# Patient Record
Sex: Male | Born: 1975 | Race: Black or African American | Hispanic: No | Marital: Single | Smoking: Current every day smoker
Health system: Southern US, Community
[De-identification: ages and names within clinical notes are randomized; demographics above are authoritative.]

---

## 1999-06-14 ENCOUNTER — Emergency Department (HOSPITAL_COMMUNITY): Admission: EM | Admit: 1999-06-14 | Discharge: 1999-06-14 | Payer: Self-pay | Admitting: Emergency Medicine

## 1999-06-14 ENCOUNTER — Encounter: Payer: Self-pay | Admitting: Emergency Medicine

## 2000-08-20 ENCOUNTER — Emergency Department (HOSPITAL_COMMUNITY): Admission: EM | Admit: 2000-08-20 | Discharge: 2000-08-20 | Payer: Self-pay | Admitting: Emergency Medicine

## 2003-10-10 ENCOUNTER — Emergency Department (HOSPITAL_COMMUNITY): Admission: EM | Admit: 2003-10-10 | Discharge: 2003-10-10 | Payer: Self-pay | Admitting: Family Medicine

## 2004-12-25 ENCOUNTER — Emergency Department (HOSPITAL_COMMUNITY): Admission: EM | Admit: 2004-12-25 | Discharge: 2004-12-25 | Payer: Self-pay | Admitting: Family Medicine

## 2006-01-09 ENCOUNTER — Emergency Department (HOSPITAL_COMMUNITY): Admission: EM | Admit: 2006-01-09 | Discharge: 2006-01-09 | Payer: Self-pay | Admitting: Family Medicine

## 2007-03-30 ENCOUNTER — Inpatient Hospital Stay (HOSPITAL_COMMUNITY): Admission: AC | Admit: 2007-03-30 | Discharge: 2007-04-14 | Payer: Self-pay
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
## 2011-02-20 LAB — CBC
HCT: 21.1 — ABNORMAL LOW
HCT: 21.3 — ABNORMAL LOW
HCT: 24.9 — ABNORMAL LOW
HCT: 25.2 — ABNORMAL LOW
HCT: 27 — ABNORMAL LOW
HCT: 27.9 — ABNORMAL LOW
HCT: 33.2 — ABNORMAL LOW
Hemoglobin: 10.3 — ABNORMAL LOW
Hemoglobin: 11 — ABNORMAL LOW
Hemoglobin: 11.2 — ABNORMAL LOW
Hemoglobin: 11.8 — ABNORMAL LOW
Hemoglobin: 7 — CL
Hemoglobin: 9.2 — ABNORMAL LOW
Hemoglobin: 9.4 — ABNORMAL LOW
Hemoglobin: 9.4 — ABNORMAL LOW
MCHC: 33.1
MCHC: 33.4
MCHC: 33.9
MCHC: 34
MCHC: 34.5
MCHC: 34.8
MCV: 76.6 — ABNORMAL LOW
MCV: 80.3
MCV: 81.4
MCV: 83.1
MCV: 83.9
MCV: 84.4
Platelets: 103 — ABNORMAL LOW
Platelets: 138 — ABNORMAL LOW
Platelets: 179
Platelets: 264
Platelets: 371
Platelets: 506 — ABNORMAL HIGH
Platelets: 60 — ABNORMAL LOW
Platelets: 75 — ABNORMAL LOW
Platelets: 85 — ABNORMAL LOW
RBC: 2.37 — ABNORMAL LOW
RBC: 2.82 — ABNORMAL LOW
RBC: 3.21 — ABNORMAL LOW
RBC: 3.29 — ABNORMAL LOW
RBC: 3.65 — ABNORMAL LOW
RBC: 4.07 — ABNORMAL LOW
RBC: 4.68
RDW: 16.9 — ABNORMAL HIGH
RDW: 17.4 — ABNORMAL HIGH
RDW: 17.8 — ABNORMAL HIGH
RDW: 17.9 — ABNORMAL HIGH
RDW: 18.1 — ABNORMAL HIGH
RDW: 18.2 — ABNORMAL HIGH
RDW: 18.3 — ABNORMAL HIGH
RDW: 19 — ABNORMAL HIGH
RDW: 19.2 — ABNORMAL HIGH
RDW: 19.4 — ABNORMAL HIGH
RDW: 19.7 — ABNORMAL HIGH
RDW: 19.7 — ABNORMAL HIGH
WBC: 10.2
WBC: 10.2
WBC: 10.8 — ABNORMAL HIGH
WBC: 11 — ABNORMAL HIGH
WBC: 12.1 — ABNORMAL HIGH
WBC: 14.8 — ABNORMAL HIGH
WBC: 19.1 — ABNORMAL HIGH

## 2011-02-20 LAB — PREPARE FRESH FROZEN PLASMA

## 2011-02-20 LAB — BASIC METABOLIC PANEL
BUN: 12
BUN: 15
BUN: 3 — ABNORMAL LOW
BUN: 4 — ABNORMAL LOW
BUN: 6
CO2: 26
CO2: 27
CO2: 27
CO2: 29
CO2: 33 — ABNORMAL HIGH
Calcium: 7.7 — ABNORMAL LOW
Calcium: 7.7 — ABNORMAL LOW
Calcium: 7.8 — ABNORMAL LOW
Calcium: 8 — ABNORMAL LOW
Calcium: 8.1 — ABNORMAL LOW
Calcium: 8.1 — ABNORMAL LOW
Calcium: 8.7
Calcium: 8.8
Calcium: 9.1
Chloride: 103
Chloride: 103
Chloride: 106
Chloride: 98
Creatinine, Ser: 0.69
Creatinine, Ser: 0.71
Creatinine, Ser: 0.73
Creatinine, Ser: 0.74
Creatinine, Ser: 0.75
Creatinine, Ser: 0.87
GFR calc Af Amer: >60
GFR calc Af Amer: >60
GFR calc Af Amer: >60
GFR calc non Af Amer: >60
GFR calc non Af Amer: >60
GFR calc non Af Amer: >60
GFR calc non Af Amer: >60
GFR calc non Af Amer: >60
GFR calc non Af Amer: >60
GFR calc non Af Amer: >60
GFR calc non Af Amer: >60
GFR calc non Af Amer: >60
Glucose, Bld: 104 — ABNORMAL HIGH
Glucose, Bld: 106 — ABNORMAL HIGH
Glucose, Bld: 118 — ABNORMAL HIGH
Glucose, Bld: 122 — ABNORMAL HIGH
Glucose, Bld: 126 — ABNORMAL HIGH
Glucose, Bld: 143 — ABNORMAL HIGH
Potassium: 3.2 — ABNORMAL LOW
Potassium: 3.2 — ABNORMAL LOW
Potassium: 4.1
Potassium: 4.2
Potassium: 4.2
Sodium: 133 — ABNORMAL LOW
Sodium: 135
Sodium: 136
Sodium: 137
Sodium: 137
Sodium: 138

## 2011-02-20 LAB — TYPE AND SCREEN

## 2011-02-20 LAB — DIFFERENTIAL
Basophils Absolute: 0
Basophils Absolute: 0
Basophils Relative: 0
Basophils Relative: 0
Eosinophils Absolute: 0 — ABNORMAL LOW
Eosinophils Absolute: 0.4
Eosinophils Relative: 1
Eosinophils Relative: 3
Lymphocytes Relative: 7 — ABNORMAL LOW
Lymphocytes Relative: 7 — ABNORMAL LOW
Lymphocytes Relative: 9 — ABNORMAL LOW
Lymphocytes Relative: 9 — ABNORMAL LOW
Lymphs Abs: 1.3
Monocytes Absolute: 0.8
Monocytes Absolute: 1
Monocytes Absolute: 1.2 — ABNORMAL HIGH
Monocytes Absolute: 1.3 — ABNORMAL HIGH
Monocytes Relative: 6
Monocytes Relative: 8
Monocytes Relative: 8
Neutro Abs: 14.5 — ABNORMAL HIGH
Neutro Abs: 8 — ABNORMAL HIGH
Neutrophils Relative %: 82 — ABNORMAL HIGH

## 2011-02-20 LAB — POCT I-STAT 3, ART BLOOD GAS (G3+)
Acid-Base Excess: 2
Acid-Base Excess: 6 — ABNORMAL HIGH
Acid-base deficit: 3 — ABNORMAL HIGH
Bicarbonate: 20.8
Bicarbonate: 25 — ABNORMAL HIGH
Bicarbonate: 28.8 — ABNORMAL HIGH
O2 Saturation: 100
O2 Saturation: 96
O2 Saturation: 97
O2 Saturation: 98
Operator id: 244851
Operator id: 252761
Operator id: 262201
Operator id: 277331
Operator id: 297551
Patient temperature: 100.1
Patient temperature: 98.7
Patient temperature: 99.2
Patient temperature: 99.3
TCO2: 22
TCO2: 26
TCO2: 29
TCO2: 30
TCO2: 32
pCO2 arterial: 33.2 — ABNORMAL LOW
pCO2 arterial: 33.3 — ABNORMAL LOW
pCO2 arterial: 42.9
pCO2 arterial: 46.5 — ABNORMAL HIGH
pCO2 arterial: 49.6 — ABNORMAL HIGH
pCO2 arterial: 49.8 — ABNORMAL HIGH
pH, Arterial: 7.392
pH, Arterial: 7.431
pH, Arterial: 7.431
pH, Arterial: 7.485 — ABNORMAL HIGH
pO2, Arterial: 102 — ABNORMAL HIGH
pO2, Arterial: 176 — ABNORMAL HIGH
pO2, Arterial: 76 — ABNORMAL LOW
pO2, Arterial: 86
pO2, Arterial: 94
pO2, Arterial: 97

## 2011-02-20 LAB — COMPREHENSIVE METABOLIC PANEL
ALT: 86 — ABNORMAL HIGH
Albumin: 2.2 — ABNORMAL LOW
Alkaline Phosphatase: 57
BUN: 4 — ABNORMAL LOW
Chloride: 108
Potassium: 3.6
Sodium: 137
Total Bilirubin: 1.2
Total Protein: 4.8 — ABNORMAL LOW

## 2011-02-20 LAB — PREPARE RBC (CROSSMATCH)

## 2011-02-20 LAB — HEMOGLOBIN: Hemoglobin: 10.1 — ABNORMAL LOW

## 2011-02-20 LAB — I-STAT 8, (EC8 V) (CONVERTED LAB)
Acid-base deficit: 6 — ABNORMAL HIGH
Glucose, Bld: 166 — ABNORMAL HIGH
HCT: 40
Hemoglobin: 13.6
Operator id: 294341
Sodium: 141
TCO2: 20
pCO2, Ven: 33.6 — ABNORMAL LOW
pH, Ven: 7.357 — ABNORMAL HIGH

## 2011-02-20 LAB — PROTIME-INR
INR: 1.4
INR: 1.6 — ABNORMAL HIGH
Prothrombin Time: 17 — ABNORMAL HIGH

## 2011-02-20 LAB — POCT I-STAT 4, (NA,K, GLUC, HGB,HCT)
Glucose, Bld: 122 — ABNORMAL HIGH
Operator id: 146111
Sodium: 140

## 2011-02-20 LAB — CULTURE, RESPIRATORY W GRAM STAIN: Gram Stain: NONE SEEN

## 2011-02-20 LAB — URINALYSIS, ROUTINE W REFLEX MICROSCOPIC
Bilirubin Urine: NEGATIVE
Hgb urine dipstick: NEGATIVE
Ketones, ur: NEGATIVE
Nitrite: NEGATIVE
Protein, ur: NEGATIVE
Specific Gravity, Urine: 1.017

## 2011-02-20 LAB — CULTURE, BAL-QUANTITATIVE W GRAM STAIN

## 2011-02-20 LAB — CATH TIP CULTURE: Culture: NO GROWTH

## 2022-03-22 ENCOUNTER — Ambulatory Visit (HOSPITAL_COMMUNITY)
Admission: EM | Admit: 2022-03-22 | Discharge: 2022-03-22 | Disposition: A | Discharge status: Home / Self Care | Payer: No Payment, Other

## 2022-03-22 DIAGNOSIS — F1410 Cocaine abuse, uncomplicated: Secondary | ICD-10-CM

## 2022-03-22 DIAGNOSIS — F1090 Alcohol use, unspecified, uncomplicated: Secondary | ICD-10-CM

## 2022-03-22 NOTE — Discharge Instructions (Addendum)
Patient is instructed prior to discharge to:  Take all medications as prescribed by his/her mental healthcare provider. Report any adverse effects and or reactions from the medicines to his/her outpatient provider promptly. Keep all scheduled appointments, to ensure that you are getting refills on time and to avoid any interruption in your medication.  If you are unable to keep an appointment call to reschedule.  Be sure to follow-up with resources and follow-up appointments provided.  Patient has been instructed & cautioned: To not engage in alcohol and or illegal drug use while on prescription medicines. In the event of worsening symptoms, patient is instructed to call the crisis hotline, 911 and or go to the nearest ED for appropriate evaluation and treatment of symptoms. To follow-up with his/her primary care provider for your other medical issues, concerns and or health care needs.  Information: -National Suicide Prevention Lifeline 1-800-SUICIDE or (769)433-0447.  -988 offers 24/7 access to trained crisis counselors who can help people experiencing mental health-related distress. People can call or text 988 or chat 988lifeline.org for themselves or if they are worried about a loved one who may need crisis support.    [facility] does not have bed availability at this time, but there is room in their Substance Abuse Intensive Outpatient Program that you can participate in.  A bed may open up while in the program.    SUBSTANCE USE TREATMENT for Medicaid and State Funded/IPRS  [facility] [address], [place], [address] [phone] phone NOTE: [facility] is no longer offering IOP services.  Serves those who are low-income or have no insurance.  [facility] [address], [place], [place], [address] [phone] phone [phone] fax NOTE: Does have Substance Abuse-Intensive Outpatient Program [facility]) as well as transitional housing if eligible.  [facility]  Health Services [address] [place], [place], [address] [phone] phone [phone] fax  [facility] [phone] [address] [place], [place], [address] [phone] phone [phone] fax  CHARITABLE RESIDENTIAL REHABS  [facility] (women only at this campus) [address] [place], [place] [address] [phone]  [facility] [place] (men only at this campus) [address] [place], [place] [address] [phone]  ((These programs listed above have a one-time application fee.))   [address], [place] [address] [phone]  [facility] [phone] [address] [place] [address] [phone] [address] [place], [place] [address] [phone], [place] [address] [phone]  [facility] (Rehab for men only) [address] [place], [place] [address] [phone]   RESIDENTIAL TREATMENT- PRIVATE PAY:  [facility] - [place], [place] [phone] Women's Division - [place], [place] [phone]  MEDICAL DETOX/RESIDENTIAL TREATMENT- MEDICAID/IPRS:  [facility] [address] [place], [place] [address] [phone]  [facility] [address] [place], [place] [address] [phone]  [facility] [address] [place]. [place], [place] [address] [phone]  [facility] [address] [place], [place] [address] [phone]  [facility] [address] [place], [place] [address] [phone]  ((For admissions to these three [facility] facilities during weekday days and possibly other times, contact [place], phone: [phone]; fax: [phone]))  [facility] (detox for men and women) [address] [place], [place] [address] [phone]  MEDICAL DETOX AND RESIDENTIAL  TREATMENT -  INSURANCE:  [facility] (also offers CD-IOP for graduates of residential program) [address]. [place], [place] [address] [phone]  [facility] (now accepting [place]) [place] [address] [phone]  [facility] (accept [facility] for some services) [address] [place], [place] [address] [phone]  OUTPATIENT PROGRAMS:  [facility] ([facility]) [place], [place] [address] [phone]  ((CD-IOP is currently not operational; Opioid replacement clinic is operational))   [facility] (private insurance) [place] [employer]. [address] [place], [place] [address] [phone]  ((CD-IOP (afternoon program); individual therapy))   [place] (Medicaid & IPRS for [facility] residents) [place], [place] [address] [phone]  ((CD-IOP; new clients must go through walk-in clinic))   [place] (Medicaid & IPRS for [facility] residents) [address] [place], [place] [address] [phone]  ((CD-IOP))   [facility] (Medicaid for [facility] [employer] and [employer]; some private insurance) [address] [place], [place] [address] [phone]  ((CD-IOP))   [facility] ([place] insurance) [phone] [address] [place], [place] [address] [phone]  ((CD-IOP (morning & evening programs); individual therapy))   HALFWAY HOUSES:  [facility] [phone]  [employer].[email or web address]   OPIOID REPLACEMENT PROVIDERS:  [facility] ([facility]) [place], [place] [address] [phone]  [place] [address] [place], [place] [address] [phone]  [facility] [address] [place], [place] [address] [phone]   12 STEP PROGRAMS:  Alcoholics Anonymous of [place]  ReportZoo.[email or web address]  Narcotics Anonymous of [place] GreenScrapbooking.dk  Al-Anon of [place], [place] [email or web address]/find-[email or web address]  [place]ar-Anon https://nar-anon.org/find-[email or web address]-meetin

## 2022-03-22 NOTE — Progress Notes (Signed)
LCSW Progress Note   Per Doran Heater, NP, this pt does not require psychiatric hospitalization at this time.  Pt is psychiatrically cleared.  Discharge instructions include several resources for outpatient substance use treatment programs.  Pt states that he would be returning to [facility] next Tuesday, 27 March 2022.  EDP Doran Heater, NP, has have been notified.  Hansel Starling, MSW, LCSW [facility] [phone] or [phone]

## 2022-03-22 NOTE — BH Assessment (Addendum)
Comprehensive Clinical Assessment (CCA) Screening, Triage and Referral Note  03/22/2022 SHARBEL SAHAGUN [id number]  Chief Complaint: Requesting detox  Visit Diagnosis: Cocaine and Alcohol.   Beauford Lando is a 46 y/o male that presents to the [facility], requesting detox from cocaine and alcohol. He started using cocaine at the age of 46 years old. He uses cocaine daily up to "an 8 ball or a quarter". Last use of cocaine was yesterday and he used a 8 ball. He also uses alcohol. He started drinking alcohol during his teenage years. He drinks alcohol 3-4 times per week. Average amount of use is "1 pint of henny". Last drink was 03/18/2022.  No current withdrawal symptoms reported of observed. No history of seizures or DT's. He has never participated in substance use treatment. No history of suicidal ideations, homicidal ideations, and/or AVH's. Currently lives with his sister. Unemployed. He has 2 children.   Patient Reported Information How did you hear about [place]? Other (Comment) ([facility] [place])  What Is the Reason for Your Visit/Call Today? "[facility] told me to come here and admit myself". "I have a drug problem". "I need detox". "Once I've detoxed, I plan to return back to [facility] for treatment".  How Long Has This Been Causing You Problems? > than 6 months  What Do You Feel Would Help You the Most Today? Alcohol or Drug Use Treatment   Have You Recently Had Any Thoughts About Hurting Yourself? No  Are You Planning to Commit Suicide/Harm Yourself At This time? No   Have you Recently Had Thoughts About Hurting Someone Karolee Ohs? No  Are You Planning to Harm Someone at This Time? No  Explanation: N/A  Have You Used Any Alcohol or Drugs in the Past 24 Hours? Yes  How Long Ago Did You Use Drugs or Alcohol? No data recorded What Did You Use and How Much? He reports using Cocaine. When asked how much he shrugs his shoulders indicating that he doesn't know.   Do You Currently Have a  Therapist/Psychiatrist? No data recorded Name of Therapist/Psychiatrist: No data recorded  Have You Been Recently Discharged From Any Office Practice or Programs? No data recorded Explanation of Discharge From Practice/Program: No data recorded   Does Patient Present under Involuntary Commitment? No data recorded   [place] of Residence: [place]  Patient Currently Receiving the Following Services: No data recorded  Determination of Need: Routine (7 days)   Options For Referral: Facility-Based Crisis   Discharge Disposition: MSE pending.     Melynda Ripple, Counselor

## 2022-03-22 NOTE — ED Provider Notes (Signed)
Behavioral Health Urgent Care Medical Screening Exam  Patient Name: [name] MRN: [id number] Date of Evaluation: 03/22/22 Chief Complaint:   Diagnosis:  Final diagnoses:  Cocaine use disorder (HCC)  Alcohol use disorder    History of Present illness: [name] is a 46 y.o. male. Patient presents voluntarily to [facility] behavioral health for walk-in assessment.   Patient is assessed, face-to-face, by nurse practitioner, seated in assessment area, no acute distress.  He  is alert and oriented, pleasant and cooperative during assessment.   Arpan presents after telephoning [facility] in [facility], [facility] directed he should present to [facility] behavioral health prior to being considered for the [facility] residential substance use program.  Patient seeking alcohol and substance use treatment.  He typically uses alcohol, up to 1 pint per day an average of 4 times per week.  Most recent alcohol use 5 days ago.  No history of alcohol-related seizure or delirium tremens.  He typically uses cocaine 7 times per week.  Most recent cocaine use on yesterday.  He denies substance use aside from alcohol and cocaine. Patient states "I have been drugging all of my life, I am ready to change."  Recent stressors include an upcoming court date related to charges of possessing cocaine and fleeing to elude.  He is uncertain of court date it is late November or early December 2023.  Patient  presents with euthymic mood, congruent affect. He  denies suicidal and homicidal ideations. Denies history of suicide attempts, denies history of self-harm.  Patient easily  contracts verbally for safety with this Clinical research associate.    Patient has normal speech and behavior.  He  denies auditory and visual hallucinations.  Patient is able to converse coherently with goal-directed thoughts and no distractibility or preoccupation.  Denies symptoms of paranoia.  Objectively there is no evidence of psychosis/mania or  delusional thinking.  Patient denies personal mental health history.  He is not linked with outpatient psychiatry, no current medications.  He denies history of inpatient psychiatric hospitalization.  No family mental health history, no family history of addiction.  [name] resides with his sister in [place]. He denies access to weapons. He is employed in [employer]. Patient endorses average sleep and appetite.  Facility based crisis unit admission offered, patient declines.  Patient states "I have some things to do and I need to work, I will find out the details about my court date and then I will come back or go to [facility]."  Patient offered support and encouragement.  He declines any person to contact for collateral information at this time.   Patient educated and verbalizes understanding of mental health resources and other crisis services in the community. They are instructed to call 911 and present to the nearest emergency room should patient experience any suicidal/homicidal ideation, auditory/visual/hallucinations, or detrimental worsening of mental health condition.     Psychiatric Specialty Exam  Presentation  General Appearance:Casual; Appropriate for Environment  Eye Contact:Good  Speech:Clear and Coherent; Normal Rate  Speech Volume:Normal  Handedness:Right   Mood and Affect  Mood: Euthymic  Affect: Appropriate; Congruent   Thought Process  Thought Processes: Coherent; Goal Directed; Linear  Descriptions of Associations:Intact  Orientation:Full (Time, Place and Person)  Thought Content:Logical; WDL    Hallucinations:None  Ideas of Reference:None  Suicidal Thoughts:No  Homicidal Thoughts:No   Sensorium  Memory: Immediate Good; Recent Good  Judgment: Good  Insight: Fair   Executive Functions  Concentration: Good  Attention Span: Good  Recall: [place] Major of Knowledge: Good  Language: Good   Psychomotor Activity   Psychomotor Activity: Normal   Assets  Assets: Communication Skills; Desire for Improvement; Housing; Leisure Time; Physical Health; Resilience; Social Support   Sleep  Sleep: Good  Number of hours: No data recorded  No data recorded  Physical Exam: Physical Exam Vitals and nursing note reviewed.  Constitutional:      Appearance: Normal appearance. He is well-developed.  HENT:     Head: Normocephalic and atraumatic.     Nose: Nose normal.  Cardiovascular:     Rate and Rhythm: Normal rate.  Pulmonary:     Effort: Pulmonary effort is normal.  Musculoskeletal:        General: Normal range of motion.     Cervical back: Normal range of motion.  Skin:    General: Skin is warm and dry.  Neurological:     Mental Status: He is alert and oriented to person, place, and time.  Psychiatric:        Attention and Perception: Attention and perception normal.        Mood and Affect: Mood and affect normal.        Speech: Speech normal.        Behavior: Behavior normal. Behavior is cooperative.        Thought Content: Thought content normal.        Cognition and Memory: Cognition and memory normal.        Judgment: Judgment normal.    Review of Systems  Constitutional: Negative.   HENT: Negative.    Eyes: Negative.   Respiratory: Negative.    Cardiovascular: Negative.   Gastrointestinal: Negative.   Genitourinary: Negative.   Musculoskeletal: Negative.   Skin: Negative.   Neurological: Negative.   Psychiatric/Behavioral:  Positive for substance abuse.    Blood pressure (!) 140/94, pulse 83, temperature 98.4 F (36.9 C), temperature source Oral, resp. rate 19, SpO2 100 %. There is no height or weight on file to calculate BMI.  Musculoskeletal: Strength & Muscle Tone: within normal limits Gait & Station: normal Patient leans: N/A   [facility] MSE Discharge Disposition for Follow up and Recommendations: Based on my evaluation the patient does not appear to have an  emergency medical condition and can be discharged with resources and follow up care in outpatient services for Medication Management and Individual Therapy Follow-up with outpatient psychiatry, resources provided. Follow-up with substance use treatment resources provided.   Lenard Lance, FNP 03/22/2022, 12:42 PM

## 2022-05-21 ENCOUNTER — Emergency Department (HOSPITAL_COMMUNITY): Payer: No Typology Code available for payment source

## 2022-05-21 ENCOUNTER — Encounter (HOSPITAL_COMMUNITY): Payer: Self-pay

## 2022-05-21 ENCOUNTER — Other Ambulatory Visit: Payer: Self-pay

## 2022-05-21 ENCOUNTER — Emergency Department (HOSPITAL_COMMUNITY)
Admission: EM | Admit: 2022-05-21 | Discharge: 2022-05-21 | Disposition: A | Discharge status: Home / Self Care | Payer: Commercial Managed Care - HMO | Attending: Emergency Medicine | Admitting: Emergency Medicine

## 2022-05-21 DIAGNOSIS — M25512 Pain in left shoulder: Secondary | ICD-10-CM | POA: Diagnosis not present

## 2022-05-21 MED ORDER — ACETAMINOPHEN 325 MG PO TABS
650.0000 mg | ORAL_TABLET | Freq: Once | ORAL | Status: AC
  Administered 2022-05-21: 650 mg via ORAL
  Filled 2022-05-21: qty 2
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
## 2022-05-21 NOTE — Discharge Instructions (Addendum)
The x-rays were reassuringly normal.  Can take Tylenol or Motrin for pain.  Follow-up with your primary if he having symptoms.  Return to the ED for new or concerning symptoms
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
## 2022-05-23 ENCOUNTER — Ambulatory Visit (HOSPITAL_COMMUNITY)
Admission: EM | Admit: 2022-05-23 | Discharge: 2022-05-23 | Disposition: A | Discharge status: Home / Self Care | Payer: Commercial Managed Care - HMO | Attending: Emergency Medicine | Admitting: Emergency Medicine

## 2022-05-23 ENCOUNTER — Encounter (HOSPITAL_COMMUNITY): Payer: Self-pay | Admitting: Emergency Medicine

## 2022-05-23 DIAGNOSIS — M25512 Pain in left shoulder: Secondary | ICD-10-CM | POA: Diagnosis not present

## 2022-05-23 MED ORDER — KETOROLAC TROMETHAMINE 30 MG/ML IJ SOLN
30.0000 mg | Freq: Once | INTRAMUSCULAR | Status: AC
  Administered 2022-05-23: 30 mg via INTRAMUSCULAR

## 2022-05-23 MED ORDER — CYCLOBENZAPRINE HCL 10 MG PO TABS: 10.0000 mg | ORAL_TABLET | Freq: Two times a day (BID) | ORAL | 0 refills | Status: AC | PRN

## 2022-05-23 MED ORDER — PREDNISONE 20 MG PO TABS: 40.0000 mg | ORAL_TABLET | Freq: Every day | ORAL | 0 refills | Status: AC

## 2022-05-23 MED ORDER — KETOROLAC TROMETHAMINE 30 MG/ML IJ SOLN
INTRAMUSCULAR | Status: AC
  Filled 2022-05-23: qty 1
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
## 2022-05-23 NOTE — Discharge Instructions (Addendum)
Today you are being treated for shoulder pain, this is most likely muscular, x-ray on 05/21/2021 is negative for injury to the bone  You have been given an injection of Toradol today here in office to help reduce pain, ideally you will see improvement in about 30 minutes  Starting tomorrow take prednisone every morning with food for 5 days continue the above process  You may use muscle relaxer twice daily as needed for additional comfort, be mindful of this medication may make you feel drowsy  You may continue use of ice or heat over the affected area in 10 to 15-minute intervals  You may massage and stretch as tolerated for comfort  You may continue activity as tolerated  If your pain continues to persist for an additional week please follow-up with orthopedics whose information is on front page for further evaluation and management
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
## 2022-06-06 ENCOUNTER — Ambulatory Visit: Payer: Commercial Managed Care - HMO | Admitting: Orthopaedic Surgery
